# Patient Record
Sex: Male | Born: 1935 | Hispanic: No | Smoking: Former smoker
Health system: Southern US, Community
[De-identification: ages and names within clinical notes are randomized; demographics above are authoritative.]

## PROBLEM LIST (undated history)

## (undated) DIAGNOSIS — F329 Major depressive disorder, single episode, unspecified: Secondary | ICD-10-CM

## (undated) DIAGNOSIS — I10 Essential (primary) hypertension: Secondary | ICD-10-CM

## (undated) DIAGNOSIS — F32A Depression, unspecified: Secondary | ICD-10-CM

---

## 2013-02-20 ENCOUNTER — Other Ambulatory Visit (HOSPITAL_COMMUNITY): Payer: Self-pay | Admitting: Family Medicine

## 2013-02-20 DIAGNOSIS — R1310 Dysphagia, unspecified: Secondary | ICD-10-CM

## 2013-02-26 ENCOUNTER — Ambulatory Visit (HOSPITAL_COMMUNITY)
Admission: RE | Admit: 2013-02-26 | Discharge: 2013-02-26 | Disposition: A | Discharge status: Home / Self Care | Payer: Medicare Other | Source: Ambulatory Visit | Attending: Family Medicine | Admitting: Family Medicine

## 2013-02-26 DIAGNOSIS — R1310 Dysphagia, unspecified: Secondary | ICD-10-CM | POA: Diagnosis present

## 2013-02-26 NOTE — Procedures (Addendum)
Objective Swallowing Evaluation: Modified Barium Swallowing Study  Patient Details  Name: Derek Fischer MRN: [id number] Date of Birth: 01/01/1924  Today's Date: 2013/03/04 Time: 1130-1200 SLP Time Calculation (min): 30 min  Past Medical History: No past medical history on file. Past Surgical History: No past surgical history on file. HPI:  Pt is an 79 year old male arriving for an Outpatient MBS from ALF facility. Pt is deaf. Reports no knonw history of trouble swallowing, denies reflux, feeling of food getting stuck or frequent coughing. Pt could not understand the written word 'pneumonia', but denies any congestion or colds. His faxed transcript reports history of CVA, wet vocal quality and signs and symptoms of reflux including cough after meals.      Assessment / Plan / Recommendation Clinical Impression  Dysphagia Diagnosis: Within Functional Limits Clinical impression: Pt presents with adequate oral and oropharyngeal function with no significant penetration or aspiration. Pt was suspected to have light standing secretions in pharynx, and scant amounts of barium mixed with this with an insignificant coating of barium seen on anterior commisure at end of exam. Esophageal sweep also revealed timely transit of bolus. Attempted barium tablet, but pt masticated the tablet. Overall pt is safe to continue a regular diet and thin liquids. Will defer to treating therapist.     Treatment Recommendation  No treatment recommended at this time    Diet Recommendation Regular;Thin liquid   Liquid Administration via: Cup;Straw Medication Administration: Whole meds with liquid Supervision: Patient able to self feed Postural Changes and/or Swallow Maneuvers: Seated upright 90 degrees    Other  Recommendations Oral Care Recommendations: Oral care BID   Follow Up Recommendations  None    Frequency and Duration        Pertinent Vitals/Pain NA    SLP Swallow Goals     General HPI: Pt is an  79 year old male arriving for an Outpatient MBS from ALF facility. Pt is deaf. Reports no knonw history of trouble swallowing, denies reflux, feeling of food getting stuck or frequent coughing. Pt could not understand the written word 'pneumonia', but denies any congestion or colds. His faxed transcript reports history of CVA, wet vocal quality and signs and symptoms of reflux including cough after meals.  Type of Study: Modified Barium Swallowing Study Reason for Referral: Objectively evaluate swallowing function Diet Prior to this Study: Regular;Thin liquids Temperature Spikes Noted: N/A Respiratory Status: Room air History of Recent Intubation: No Behavior/Cognition: Alert;Cooperative;Pleasant mood Oral Cavity - Dentition: Adequate natural dentition Oral Motor / Sensory Function: Within functional limits Self-Feeding Abilities: Able to feed self Patient Positioning: Upright in chair Baseline Vocal Quality: Clear Volitional Cough: Strong Volitional Swallow: Able to elicit Anatomy: Within functional limits Pharyngeal Secretions: Not observed secondary MBS    Reason for Referral Objectively evaluate swallowing function   Oral Phase Oral Preparation/Oral Phase Oral Phase: WFL   Pharyngeal Phase Pharyngeal Phase Pharyngeal Phase: Within functional limits  Cervical Esophageal Phase    GO  04-Mar-2013 1200  SLP G-Codes **NOT FOR INPATIENT CLASS**  Functional Assessment Tool Used clinical judgement  Functional Limitations Swallowing  Swallow Current Status (I6962) [facility]  Swallow Goal Status (X5284) [facility]  Swallow Discharge Status (X3244) [facility]  SLP Evaluations  $ SLP Speech Visit 1 Procedure  SLP Evaluations     $MBS Swallow Outpatient 1 Procedure     Cervical Esophageal Phase Cervical Esophageal Phase: [facility]        Harlon Ditty, MA CCC-SLP [phone]  [place] Bridge, Riley Nearing 2013/03/04, 12:20  PM   

## 2017-05-29 ENCOUNTER — Emergency Department (HOSPITAL_BASED_OUTPATIENT_CLINIC_OR_DEPARTMENT_OTHER)
Admission: EM | Admit: 2017-05-29 | Discharge: 2017-05-29 | Disposition: A | Discharge status: Nursing Home (Includes ICF) | Payer: Medicare Other | Attending: Emergency Medicine | Admitting: Emergency Medicine

## 2017-05-29 ENCOUNTER — Emergency Department (HOSPITAL_BASED_OUTPATIENT_CLINIC_OR_DEPARTMENT_OTHER): Payer: Medicare Other

## 2017-05-29 ENCOUNTER — Encounter (HOSPITAL_BASED_OUTPATIENT_CLINIC_OR_DEPARTMENT_OTHER): Payer: Self-pay | Admitting: *Deleted

## 2017-05-29 ENCOUNTER — Other Ambulatory Visit: Payer: Self-pay

## 2017-05-29 DIAGNOSIS — Y939 Activity, unspecified: Secondary | ICD-10-CM | POA: Insufficient documentation

## 2017-05-29 DIAGNOSIS — G9389 Other specified disorders of brain: Secondary | ICD-10-CM | POA: Diagnosis not present

## 2017-05-29 DIAGNOSIS — W050XXA Fall from non-moving wheelchair, initial encounter: Secondary | ICD-10-CM | POA: Diagnosis not present

## 2017-05-29 DIAGNOSIS — W19XXXA Unspecified fall, initial encounter: Secondary | ICD-10-CM

## 2017-05-29 DIAGNOSIS — M549 Dorsalgia, unspecified: Secondary | ICD-10-CM | POA: Diagnosis not present

## 2017-05-29 DIAGNOSIS — Z79899 Other long term (current) drug therapy: Secondary | ICD-10-CM | POA: Insufficient documentation

## 2017-05-29 DIAGNOSIS — Y92199 Unspecified place in other specified residential institution as the place of occurrence of the external cause: Secondary | ICD-10-CM | POA: Insufficient documentation

## 2017-05-29 DIAGNOSIS — Z87891 Personal history of nicotine dependence: Secondary | ICD-10-CM | POA: Diagnosis not present

## 2017-05-29 DIAGNOSIS — Y999 Unspecified external cause status: Secondary | ICD-10-CM | POA: Diagnosis not present

## 2017-05-29 DIAGNOSIS — M533 Sacrococcygeal disorders, not elsewhere classified: Secondary | ICD-10-CM

## 2017-05-29 DIAGNOSIS — I10 Essential (primary) hypertension: Secondary | ICD-10-CM | POA: Diagnosis not present

## 2017-05-29 HISTORY — DX: Depression, unspecified: F32.A

## 2017-05-29 HISTORY — DX: Major depressive disorder, single episode, unspecified: F32.9

## 2017-05-29 HISTORY — DX: Essential (primary) hypertension: I10

## 2017-05-29 MED ORDER — ACETAMINOPHEN 500 MG PO TABS: 500.0000 mg | ORAL_TABLET | Freq: Four times a day (QID) | ORAL | 0 refills | Status: AC | PRN

## 2017-05-29 NOTE — ED Notes (Signed)
[facility] arrived to transport pt back to [facility]. 

## 2017-05-29 NOTE — ED Notes (Signed)
Pt updated with plan of care. Pt waiting for [facility] arrival to transport pt back to [facility]

## 2017-05-29 NOTE — ED Notes (Signed)
Spoke with Eighty FourHolly, LPN at [facility] and gave report on pt.

## 2017-05-29 NOTE — ED Notes (Signed)
Pt returned from CT/Xray. 

## 2017-05-29 NOTE — ED Provider Notes (Signed)
[facility] EMERGENCY DEPARTMENT Provider Note   CSN: [id number] Arrival date & time: 05/29/17  1539     History   Chief Complaint Chief Complaint  Patient presents with  . Fall    HPI Derek Fischer is a 84 y.o. male with PMHx HTN, hearing loss, presenting to the ED via EMS s/p mechanical fall at assisted living facility.  Patient states he was attempting to have a bowel movement in a small trash can, as his roommate was using the bathroom, and states his wheelchair slid out from under him.  He states the left break on his chair does not work, and therefore caused the chair to roll out from under him as he was attempting to have the bowel movement.  He states he fell directly onto his bottom, from the height of the seat of his wheelchair.  He states he bumped his head very minorly, however "not enough to cause pain."  He localizes pain to the tailbone, which has significantly improved.  Denies numbness or weakness in extremities, headache, LOC, or other complaints.  Patient is not on anticoagulation.  The history is provided by the patient. History limited by: pt with significant hearing loss. Communication done by writing questions on notepad and patient verbally answering.    Past Medical History:  Diagnosis Date  . Depression   . Hypertension     There are no active problems to display for this patient.   History reviewed. No pertinent surgical history.     Home Medications    Prior to Admission medications   Medication Sig Start Date End Date Taking? Authorizing Provider  furosemide (LASIX) 20 MG tablet Take 20 mg by mouth.   Yes [provider]  metoprolol tartrate (LOPRESSOR) 25 MG tablet Take 25 mg by mouth 2 (two) times daily.   Yes [provider]  potassium chloride SA (K-DUR,KLOR-CON) 20 MEQ tablet Take 20 mEq by mouth 2 (two) times daily.   Yes [provider]  sertraline (ZOLOFT) 25 MG tablet Take 12.5 mg by mouth daily.    Yes [provider]    Family History History reviewed. No pertinent family history.  Social History Social History   Tobacco Use  . Smoking status: Former Games developer  . Smokeless tobacco: Never Used  Substance Use Topics  . Alcohol use: Not on file  . Drug use: Not on file     Allergies   Codeine; Iodine; Lipitor [atorvastatin calcium]; Tramadol; and Zithromax [azithromycin]   Review of Systems Review of Systems  HENT: Positive for hearing loss (chronic).   Musculoskeletal: Positive for back pain.  Neurological: Negative for syncope, numbness and headaches.  All other systems reviewed and are negative.    Physical Exam Updated Vital Signs BP 131/65 (BP Location: Left Arm)   Pulse 70   Resp 20   SpO2 98%   Physical Exam  Constitutional: He appears well-developed and well-nourished. No distress.  Well-appearing, resting comfortably.   HENT:  Head: Normocephalic and atraumatic.  No scalp hematoma or tenderness  Eyes: Conjunctivae and EOM are normal. Pupils are equal, round, and reactive to light.  Neck: Normal range of motion. Neck supple.  Cardiovascular: Normal rate, regular rhythm and intact distal pulses.  Pulmonary/Chest: Effort normal and breath sounds normal. No respiratory distress.  Abdominal: Soft. Bowel sounds are normal. He exhibits no distension. There is no tenderness.  Musculoskeletal: Normal range of motion. He exhibits no edema or deformity.  No midline spinal or paraspinal tenderness,  no bony step-offs or gross deformities.  Mild generalized tenderness to sacral region.  Bilateral upper and lower extremities appear atraumatic with normal range of motion.  Neurological: He is alert. He displays normal reflexes. He exhibits normal muscle tone.  Cranial nerves grossly intact (unable to fully assess secondary to patient's hearing loss)  PERRL, EOM normal.  5/5 strength bilateral upper extremities, equal strength with b/l hip flexion. Intact distal  pulses.  Skin: Skin is warm.  Psychiatric: He has a normal mood and affect. His behavior is normal.  Nursing note and vitals reviewed.    ED Treatments / Results  Labs (all labs ordered are listed, but only abnormal results are displayed) Labs Reviewed - No data to display  EKG  EKG Interpretation None       Radiology Dg Lumbar Spine Complete  Result Date: 05/29/2017 CLINICAL DATA:  Fall, tailbone soreness. EXAM: LUMBAR SPINE - COMPLETE 4+ VIEW COMPARISON:  None. FINDINGS: Diffuse osteopenia. Dextroscoliosis of the lumbar spine, mild to moderate in degree. Compression fracture deformities of the L1, L2 and L4 vertebral bodies, each compressed approximately 50%, of uncertain age but most likely chronic. No evidence of significant associated displacement or retropulsion. Additional similar compression fracture deformities of the T8, T9 and T12 vertebral bodies. No fracture line or displaced fracture fragment identified. No evidence of pars interarticularis defect. Upper sacrum appears intact and normally aligned. Extensive atherosclerosis of the abdominal aorta visualized paravertebral soft tissues are otherwise unremarkable. IMPRESSION: 1. Compression fracture deformities of multiple lumbar and lower thoracic vertebral bodies, each compressed approximately 50%, all of uncertain age but all are favored to be chronic. 2. Scoliosis. 3. Aortic atherosclerosis. Electronically Signed   By: Bary RichardStan  Maynard M.D.   On: 05/29/2017 16:59   Dg Pelvis 1-2 Views  Result Date: 05/29/2017 CLINICAL DATA:  Fall, tailbone soreness EXAM: PELVIS - 1-2 VIEW COMPARISON:  None. FINDINGS: Intramedullary dynamic screw fixation of the right femoral neck. Visualized hardware appears intact and appropriately position. There is no evidence of acute pelvic fracture or diastasis. No pelvic bone lesions are seen. Soft tissues about the pelvis are unremarkable. IMPRESSION: No acute fracture or dislocation seen. Fixation  hardware at the right hip appears intact and appropriately position Electronically Signed   By: Bary RichardStan  Maynard M.D.   On: 05/29/2017 17:00   Ct Head Wo Contrast  Result Date: 05/29/2017 CLINICAL DATA:  Fall EXAM: CT HEAD WITHOUT CONTRAST CT CERVICAL SPINE WITHOUT CONTRAST TECHNIQUE: Multidetector CT imaging of the head and cervical spine was performed following the standard protocol without intravenous contrast. Multiplanar CT image reconstructions of the cervical spine were also generated. COMPARISON:  None. FINDINGS: CT HEAD FINDINGS Brain: Generalized age related parenchymal atrophy with commensurate dilatation of the ventricles and sulci. Mild chronic small vessel ischemic changes within the periventricular white matter bilaterally. Old infarct within the upper left cerebral hemisphere with associated encephalomalacia. No mass, hemorrhage, edema or other evidence of acute parenchymal abnormality. No extra-axial hemorrhage. Vascular: There are chronic calcified atherosclerotic changes of the large vessels at the skull base. No unexpected hyperdense vessel. Skull: Normal. Negative for fracture or focal lesion. Sinuses/Orbits: No acute finding. Other: None. CT CERVICAL SPINE FINDINGS Alignment: Mild scoliosis. No evidence of acute vertebral body subluxation. Skull base and vertebrae: No fracture line or displaced fracture fragment seen. Facet joints appear intact and normally aligned throughout. Soft tissues and spinal canal: No prevertebral fluid or swelling. No visible canal hematoma. Disc levels: Degenerative changes throughout the mid and lower cervical spine,  mild to moderate in degree with associated disc space narrowings and mild osseous spurring. Mild disc-osteophytic bulges at multiple levels, but no more than mild central canal stenosis at any level. Upper chest: Mild emphysematous change at the lung apices. No acute findings. Other: Carotid atherosclerosis.  Right carotid stent. IMPRESSION: 1. No  acute intracranial abnormality. No intracranial mass, hemorrhage or edema. No skull fracture. Chronic ischemic changes, as detailed above. 2. No fracture or acute subluxation within the cervical spine. Mild degenerative change in the mid and lower cervical spine. 3. Carotid atherosclerosis. 4. Mild emphysematous change at the lung apices. Electronically Signed   By: Bary Richard M.D.   On: 05/29/2017 17:05   Ct Cervical Spine Wo Contrast  Result Date: 05/29/2017 CLINICAL DATA:  Fall EXAM: CT HEAD WITHOUT CONTRAST CT CERVICAL SPINE WITHOUT CONTRAST TECHNIQUE: Multidetector CT imaging of the head and cervical spine was performed following the standard protocol without intravenous contrast. Multiplanar CT image reconstructions of the cervical spine were also generated. COMPARISON:  None. FINDINGS: CT HEAD FINDINGS Brain: Generalized age related parenchymal atrophy with commensurate dilatation of the ventricles and sulci. Mild chronic small vessel ischemic changes within the periventricular white matter bilaterally. Old infarct within the upper left cerebral hemisphere with associated encephalomalacia. No mass, hemorrhage, edema or other evidence of acute parenchymal abnormality. No extra-axial hemorrhage. Vascular: There are chronic calcified atherosclerotic changes of the large vessels at the skull base. No unexpected hyperdense vessel. Skull: Normal. Negative for fracture or focal lesion. Sinuses/Orbits: No acute finding. Other: None. CT CERVICAL SPINE FINDINGS Alignment: Mild scoliosis. No evidence of acute vertebral body subluxation. Skull base and vertebrae: No fracture line or displaced fracture fragment seen. Facet joints appear intact and normally aligned throughout. Soft tissues and spinal canal: No prevertebral fluid or swelling. No visible canal hematoma. Disc levels: Degenerative changes throughout the mid and lower cervical spine, mild to moderate in degree with associated disc space narrowings and  mild osseous spurring. Mild disc-osteophytic bulges at multiple levels, but no more than mild central canal stenosis at any level. Upper chest: Mild emphysematous change at the lung apices. No acute findings. Other: Carotid atherosclerosis.  Right carotid stent. IMPRESSION: 1. No acute intracranial abnormality. No intracranial mass, hemorrhage or edema. No skull fracture. Chronic ischemic changes, as detailed above. 2. No fracture or acute subluxation within the cervical spine. Mild degenerative change in the mid and lower cervical spine. 3. Carotid atherosclerosis. 4. Mild emphysematous change at the lung apices. Electronically Signed   By: Bary Richard M.D.   On: 05/29/2017 17:05    Procedures Procedures (including critical care time)  Medications Ordered in ED Medications - No data to display   Initial Impression / Assessment and Plan / ED Course  I have reviewed the triage vital signs and the nursing notes.  Pertinent labs & imaging results that were available during my care of the patient were reviewed by me and considered in my medical decision making (see chart for details).     Patient presenting from nursing home status post mechanical fall.  Patient states he bumped his head, however not have any pain associated.  Localizing pain to coccyx region.  No red flags.  Neuro exam is grossly normal, difficult to fully assess secondary to patient's significant hearing loss.  No scalp hematoma.  Patient denies numbness in extremities, with equal strength and ambulating in the ED.  Lumbar spine x-ray with compression fractures favored chronic.  Pelvic x-ray negative for acute pathology.  CT  head and C-spine negative for acute pathology.  Patient is well-appearing, not in distress in the ED. Denies pain prior to discharge. Safe for discharge back to nursing facility.  Patient discussed with and seen by Dr. Fredderick Phenix, who agrees with care plan.  Discussed results, findings, treatment and follow up.  Patient advised of return precautions. Patient verbalized understanding and agreed with plan.   Final Clinical Impressions(s) / ED Diagnoses   Final diagnoses:  Fall, initial encounter  Sacral back pain    ED Discharge Orders    None       Karee Forge, [place] N, PA-C 05/29/17 1727    Rolan Bucco, MD 05/29/17 2337

## 2017-05-29 NOTE — ED Notes (Signed)
Pt reports his left sided brake on his wheelchair does not work, pt was going to use the bathroom and when he stood up he fell. Pt reports tailbone "soreness", but admits it resolves. Pt reports he is able to stand and pivot. Pt HOH. Pt appears to be in NAD. EMS reports nursing home staff said he slid his head down the wall. No LOC. No visible injuries.

## 2017-05-29 NOTE — ED Notes (Signed)
Pt requesting for his daughter to transport pt back to [facility]. This RN spoke with Derek HatchetSheila (his daughter) and daughter is unable to transport pt at this time. Pt made aware. Pt very restless on stretcher. Meal provided.

## 2017-05-29 NOTE — ED Triage Notes (Signed)
Pt to room 13 by ems, reporting unwitnessed fall at [facility]. Pt cc is pain to tailbone area, when asked pt states "that is so much better now." pt denies any other injuries or c/o, states he was trying to have bm in trash can when he slipped and fell out of his w/c.

## 2017-05-29 NOTE — ED Notes (Signed)
[facility] phoned for pt transport home.

## 2017-05-29 NOTE — Discharge Instructions (Signed)
Please read instructions below. The xrays of your lumbar spine and pelvis are not showing any new fractures. The CT scan of your head and neck are reassuring. Apply ice to your back for 20 minutes at a time. You can take tylenol every 4-6 hours as needed for pain. Schedule an appointment with your primary care provider if symptoms persist.  Return to ER if severe headache, new numbness or tingling in your arms or legs, inability to urinate, inability to hold your bowels, or weakness in your extremities.

## 2017-05-29 NOTE — ED Notes (Signed)
Patient transported to X-ray 

## 2019-07-06 IMAGING — CT CT CERVICAL SPINE W/O CM
3 of 7 series · 12 of 33 positions shown, 13 images · non-contrast
Comparison: None.

CLINICAL DATA: Fall

EXAM:
CT HEAD WITHOUT CONTRAST
CT CERVICAL SPINE WITHOUT CONTRAST
TECHNIQUE: Multidetector CT imaging of the head and cervical spine was
performed following the standard protocol without intravenous
contrast. Multiplanar CT image reconstructions of the cervical spine
were also generated.

[Series 4: head 3.0 mpr cor · coronal · 0.30mm/px · 3 of 70 slices shown]
[im 18/70  bone]
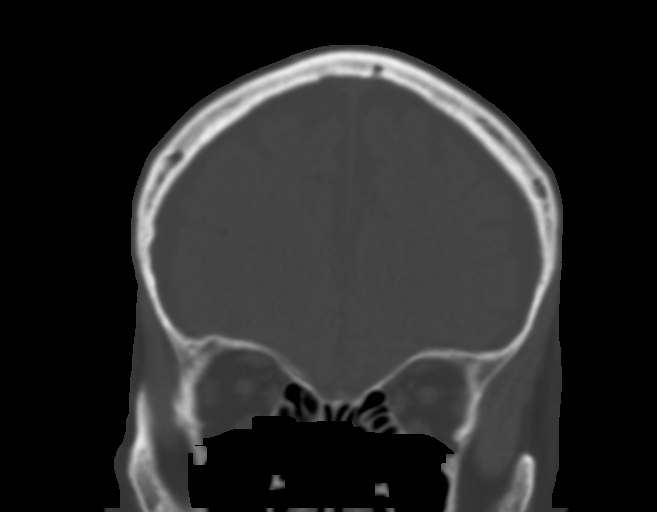
[im 35/70  bone]
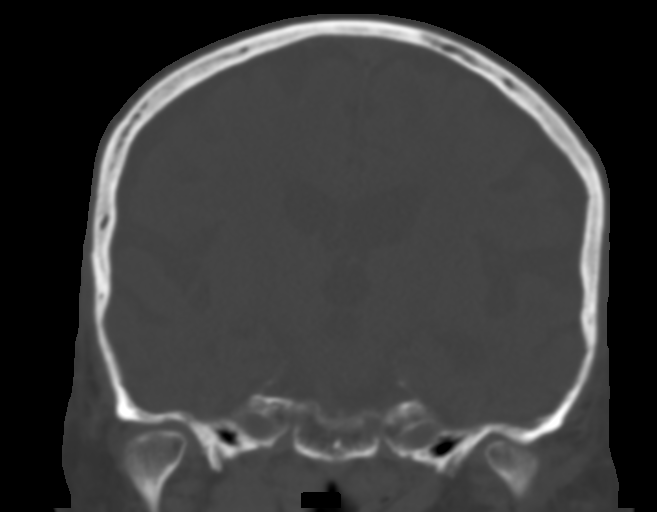
[im 52/70  bone]
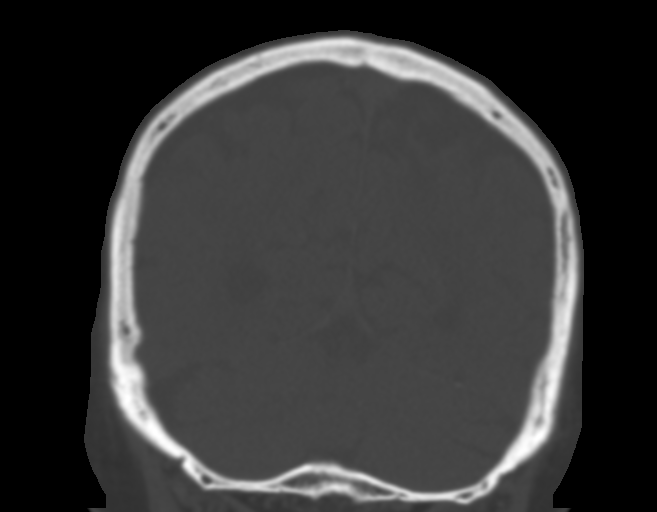

[Series 10: sagittals · sagittal · 0.25mm/px · 5 of 79 slices shown]
[im 14/79  bone]
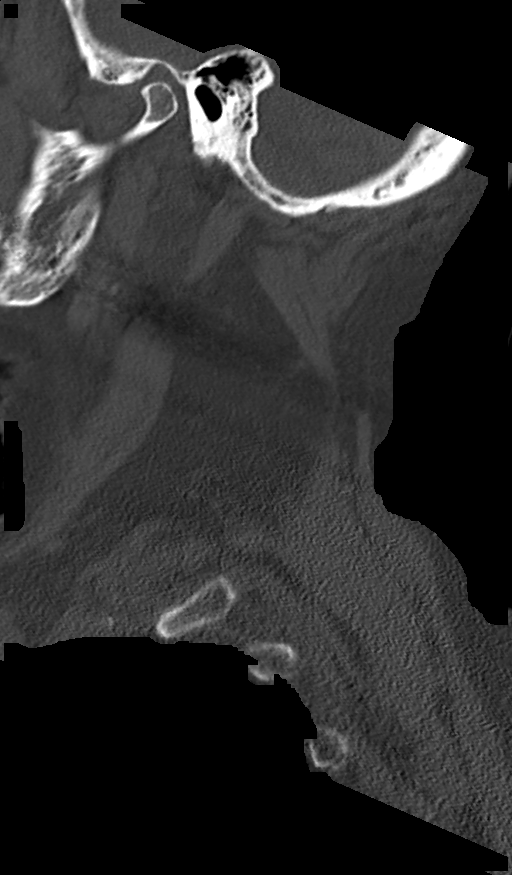
[im 27/79  bone]
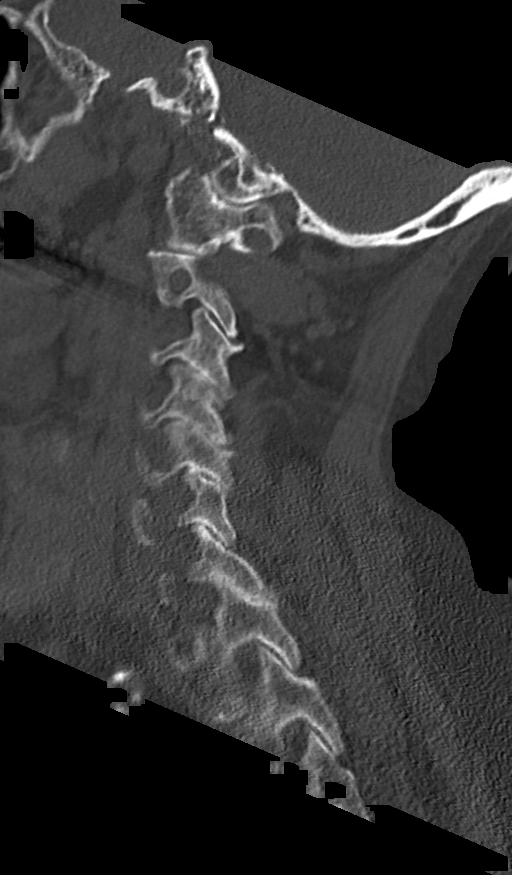
[im 40/79  bone]
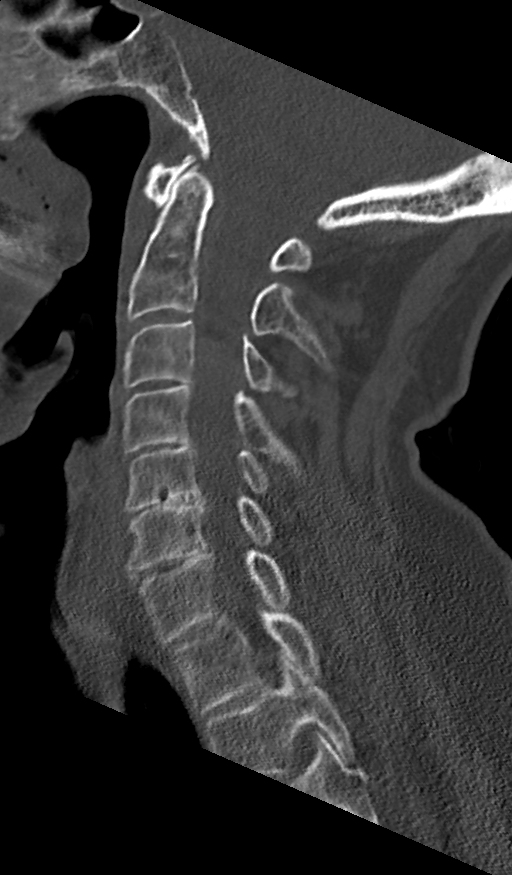
[im 53/79  bone]
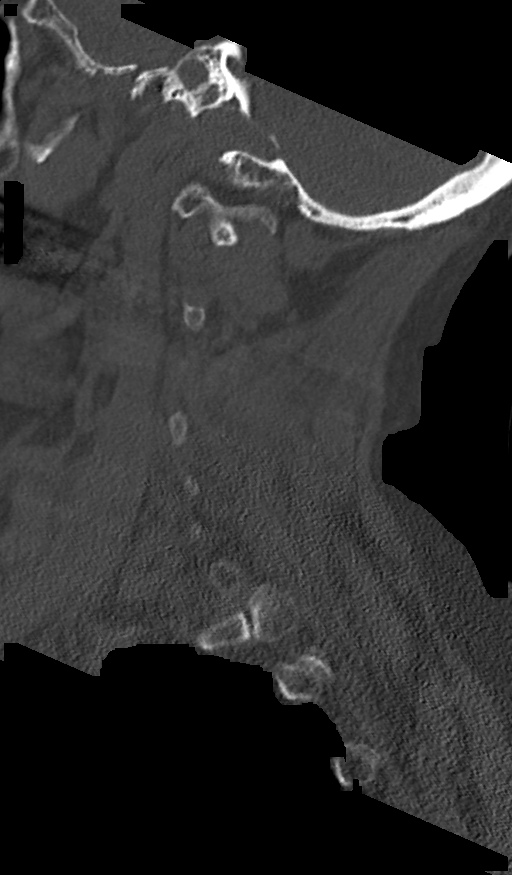
[im 66/79  bone]
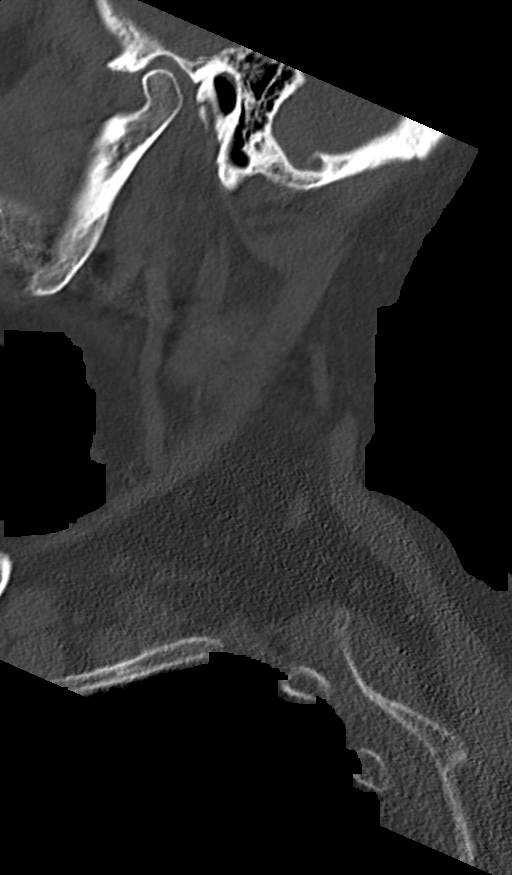

[Series 11: orthogonals · axial · 0.25mm/px · z∈[+1132,+1264]mm · 4 of 109 slices shown, 5 images]
[im 19/109  soft-tissue]
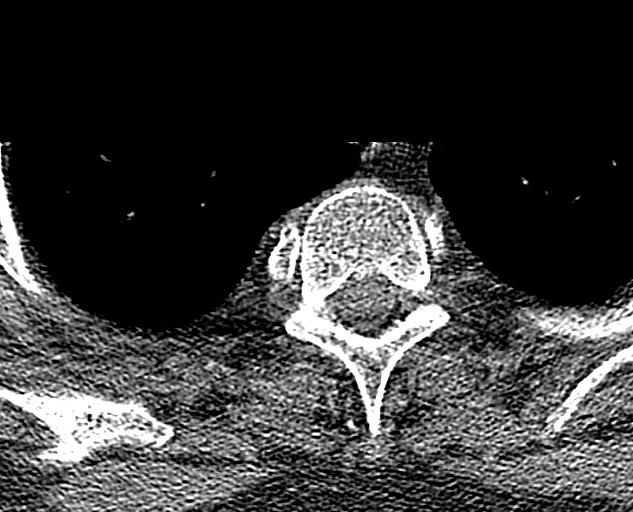
[im 19/109  bone]
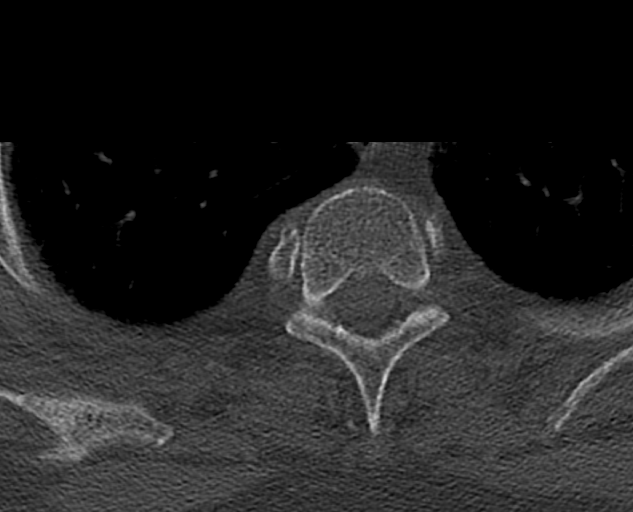
[im 37/109  bone]
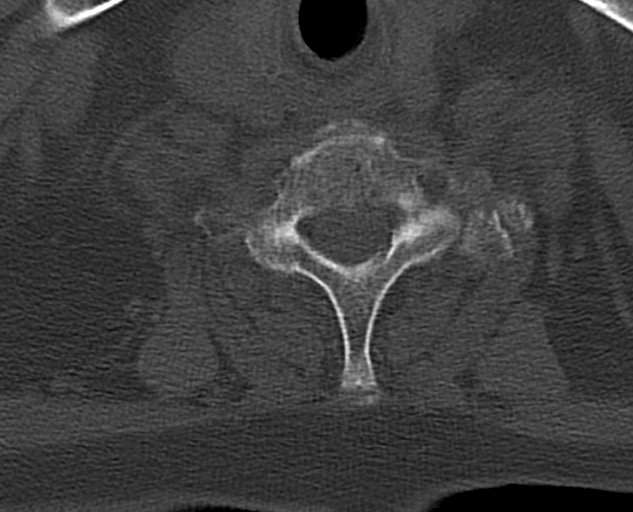
[im 73/109  bone]
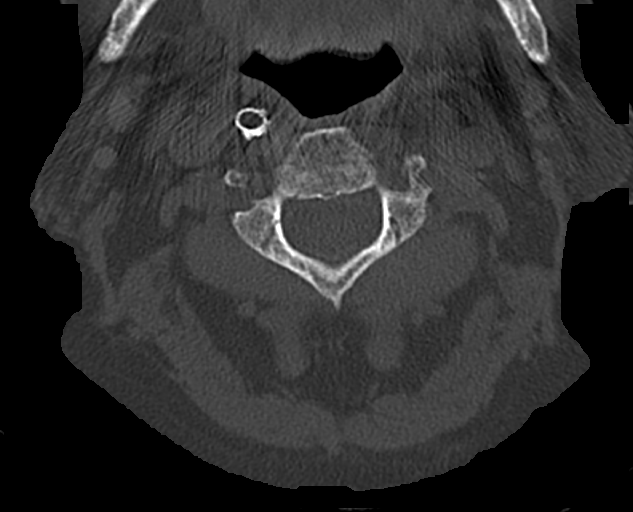
[im 91/109  bone]
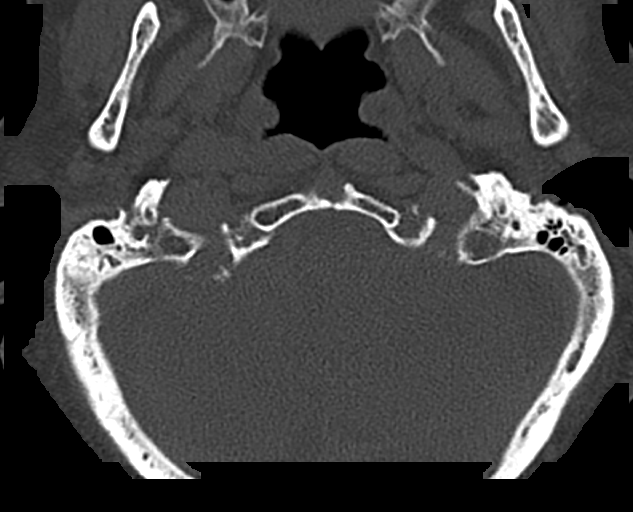

[12 of 33 positions shown; findings below may reference images not displayed]

FINDINGS: CT HEAD FINDINGS

Brain: Generalized age related parenchymal atrophy with commensurate
dilatation of the ventricles and sulci. Mild chronic small vessel
ischemic changes within the periventricular white matter
bilaterally. Old infarct within the upper left cerebral hemisphere
with associated encephalomalacia.

No mass, hemorrhage, edema or other evidence of acute parenchymal
abnormality. No extra-axial hemorrhage.

Vascular: There are chronic calcified atherosclerotic changes of the
large vessels at the skull base. No unexpected hyperdense vessel.

Skull: Normal. Negative for fracture or focal lesion.

Sinuses/Orbits: No acute finding.

Other: None.

CT CERVICAL SPINE FINDINGS

Alignment: Mild scoliosis. No evidence of acute vertebral body
subluxation.

Skull base and vertebrae: No fracture line or displaced fracture
fragment seen. Facet joints appear intact and normally aligned
throughout.

Soft tissues and spinal canal: No prevertebral fluid or swelling. No
visible canal hematoma.

Disc levels: Degenerative changes throughout the mid and lower
cervical spine, mild to moderate in degree with associated disc
space narrowings and mild osseous spurring. Mild disc-osteophytic
bulges at multiple levels, but no more than mild central canal
stenosis at any level.

Upper chest: Mild emphysematous change at the lung apices. No acute
findings.

Other: Carotid atherosclerosis.  Right carotid stent.
IMPRESSION: 1. No acute intracranial abnormality. No intracranial mass,
hemorrhage or edema. No skull fracture. Chronic ischemic changes, as
detailed above.
2. No fracture or acute subluxation within the cervical spine. Mild
degenerative change in the mid and lower cervical spine.
3. Carotid atherosclerosis.
4. Mild emphysematous change at the lung apices.

## 2021-06-19 DEATH — deceased
